# Patient Record
Sex: Male | Born: 1995 | Race: White | Hispanic: No | Marital: Single | Smoking: Former smoker
Health system: Southern US, Community
[De-identification: ages and names within clinical notes are randomized; demographics above are authoritative.]

## PROBLEM LIST (undated history)

## (undated) DIAGNOSIS — M359 Systemic involvement of connective tissue, unspecified: Secondary | ICD-10-CM

## (undated) DIAGNOSIS — M329 Systemic lupus erythematosus, unspecified: Secondary | ICD-10-CM

## (undated) DIAGNOSIS — I517 Cardiomegaly: Secondary | ICD-10-CM

## (undated) DIAGNOSIS — K219 Gastro-esophageal reflux disease without esophagitis: Secondary | ICD-10-CM

## (undated) DIAGNOSIS — I10 Essential (primary) hypertension: Secondary | ICD-10-CM

## (undated) DIAGNOSIS — M1990 Unspecified osteoarthritis, unspecified site: Secondary | ICD-10-CM

## (undated) DIAGNOSIS — I7300 Raynaud's syndrome without gangrene: Secondary | ICD-10-CM

## (undated) HISTORY — PX: TONSILLECTOMY: SUR1361

## (undated) HISTORY — PX: DENTAL SURGERY: SHX609

---

## 2015-10-18 DIAGNOSIS — M0800 Unspecified juvenile rheumatoid arthritis of unspecified site: Secondary | ICD-10-CM | POA: Insufficient documentation

## 2015-10-18 DIAGNOSIS — I7300 Raynaud's syndrome without gangrene: Secondary | ICD-10-CM | POA: Insufficient documentation

## 2015-12-22 DIAGNOSIS — J45909 Unspecified asthma, uncomplicated: Secondary | ICD-10-CM | POA: Insufficient documentation

## 2015-12-22 DIAGNOSIS — I517 Cardiomegaly: Secondary | ICD-10-CM | POA: Insufficient documentation

## 2015-12-22 DIAGNOSIS — Z79899 Other long term (current) drug therapy: Secondary | ICD-10-CM | POA: Insufficient documentation

## 2015-12-22 DIAGNOSIS — D509 Iron deficiency anemia, unspecified: Secondary | ICD-10-CM | POA: Insufficient documentation

## 2015-12-22 DIAGNOSIS — I10 Essential (primary) hypertension: Secondary | ICD-10-CM | POA: Insufficient documentation

## 2016-11-23 DIAGNOSIS — F330 Major depressive disorder, recurrent, mild: Secondary | ICD-10-CM | POA: Insufficient documentation

## 2018-01-31 ENCOUNTER — Encounter (HOSPITAL_COMMUNITY): Payer: Self-pay

## 2018-01-31 ENCOUNTER — Emergency Department (HOSPITAL_COMMUNITY)
Admission: EM | Admit: 2018-01-31 | Discharge: 2018-01-31 | Disposition: A | Discharge status: Home / Self Care | Payer: Medicaid Other | Attending: Emergency Medicine | Admitting: Emergency Medicine

## 2018-01-31 ENCOUNTER — Other Ambulatory Visit: Payer: Self-pay

## 2018-01-31 ENCOUNTER — Emergency Department (HOSPITAL_COMMUNITY): Payer: Medicaid Other

## 2018-01-31 DIAGNOSIS — Z87891 Personal history of nicotine dependence: Secondary | ICD-10-CM | POA: Insufficient documentation

## 2018-01-31 DIAGNOSIS — G8929 Other chronic pain: Secondary | ICD-10-CM | POA: Diagnosis not present

## 2018-01-31 DIAGNOSIS — M25561 Pain in right knee: Secondary | ICD-10-CM | POA: Diagnosis present

## 2018-01-31 DIAGNOSIS — M3210 Systemic lupus erythematosus, organ or system involvement unspecified: Secondary | ICD-10-CM | POA: Insufficient documentation

## 2018-01-31 HISTORY — DX: Unspecified osteoarthritis, unspecified site: M19.90

## 2018-01-31 HISTORY — DX: Systemic lupus erythematosus, unspecified: M32.9

## 2018-01-31 HISTORY — DX: Cardiomegaly: I51.7

## 2018-01-31 HISTORY — DX: Essential (primary) hypertension: I10

## 2018-01-31 HISTORY — DX: Gastro-esophageal reflux disease without esophagitis: K21.9

## 2018-01-31 HISTORY — DX: Systemic involvement of connective tissue, unspecified: M35.9

## 2018-01-31 HISTORY — DX: Raynaud's syndrome without gangrene: I73.00

## 2018-01-31 NOTE — ED Provider Notes (Signed)
[facility]-EMERGENCY DEPT Provider Note   CSN: [id number] Arrival date & time: 01/31/18  1884     History   Chief Complaint Chief Complaint  Patient presents with  . Knee Pain    HPI Dwayne Anderson is a 22 y.o. male.  HPI Patient is a 22 year old male presents the emergency department with complaints of right knee pain over the past 3 days.  He has had recurrent right knee pain for approximately a year.  He states he had a proximal tibia fracture 1 year ago and never had orthopedic surgery which was recommended to him at the time.  Denies fevers and chills.  Has a history of rheumatoid arthritis.  Reports pain and swelling to his right knee intermittently since then.  Over the past 3 days has had increasing moderate pain.   Past Medical History:  Diagnosis Date  . Arthritis   . Connective tissue disorder (HCC)   . Enlarged heart   . GERD (gastroesophageal reflux disease)   . Hypertension   . Lupus (HCC)   . Raynaud's disease     There are no active problems to display for this patient.   Past Surgical History:  Procedure Laterality Date  . DENTAL SURGERY    . TONSILLECTOMY          Home Medications    Prior to Admission medications   Medication Sig Start Date End Date Taking? Authorizing Provider  acetaminophen (TYLENOL) 500 MG tablet Take 2,000 mg by mouth 2 (two) times daily.   Yes [provider]  diphenhydrAMINE (BENADRYL) 25 MG tablet Take 25 mg by mouth every 6 (six) hours as needed for itching or allergies.   Yes [provider]  ibuprofen (ADVIL,MOTRIN) 200 MG tablet Take 400 mg by mouth every 6 (six) hours as needed for moderate pain.   Yes [provider]    Family History History reviewed. No pertinent family history.  Social History Social History   Tobacco Use  . Smoking status: Former Games developer  . Smokeless tobacco: Never Used  Substance Use Topics  . Alcohol use: Yes    Comment: occasionally    . Drug use: Not Currently    Types: Marijuana     Allergies   Other   Review of Systems Review of Systems  All other systems reviewed and are negative.    Physical Exam Updated Vital Signs BP 128/88 (BP Location: Left Arm)   Pulse 91   Temp 98.4 F (36.9 C) (Oral)   Resp 18   Ht 5\' 11"  (1.803 m)   Wt 64.4 kg (142 lb)   SpO2 100%   BMI 19.80 kg/m   Physical Exam  Constitutional: He is oriented to person, place, and time. He appears well-developed and well-nourished.  HENT:  Head: Normocephalic.  Eyes: EOM are normal.  Neck: Normal range of motion.  Pulmonary/Chest: Effort normal.  Abdominal: He exhibits no distension.  Musculoskeletal:  Small right knee effusion.  No surrounding erythema.  Able to range right knee although with some pain.  Compartments soft.  Normal pulses right foot.  Neurological: He is alert and oriented to person, place, and time.  Psychiatric: He has a normal mood and affect.  Nursing note and vitals reviewed.    ED Treatments / Results  Labs (all labs ordered are listed, but only abnormal results are displayed) Labs Reviewed - No data to display  EKG None  Radiology Dg Knee Complete 4 Views Right  Result Date: 01/31/2018  CLINICAL DATA:  22 year old with current history of rheumatoid arthritis and lupus, presenting with a 3 day history of RIGHT knee pain. No known injuries. EXAM: RIGHT KNEE - COMPLETE 4+ VIEW COMPARISON:  None. FINDINGS: No evidence of acute or subacute fracture or dislocation. Well-preserved joint spaces. No intrinsic osseous abnormality. Small to moderate-sized joint effusion. IMPRESSION: No osseous abnormality.  Small to moderate-sized joint effusion. Electronically Signed   By: Hulan Saas M.D.   On: 01/31/2018 08:02    Procedures Procedures (including critical care time)  Medications Ordered in ED Medications - No data to display   Initial Impression / Assessment and Plan / ED Course  I have reviewed  the triage vital signs and the nursing notes.  Pertinent labs & imaging results that were available during my care of the patient were reviewed by me and considered in my medical decision making (see chart for details).     Suspect internal derangement.  X-ray without osseous abnormalities.  Patient given the referral number for an orthopedic surgeon.  Recommended ibuprofen and Tylenol.  Recommended ice, elevation, rest.  Final Clinical Impressions(s) / ED Diagnoses   Final diagnoses:  Recurrent pain of right knee    ED Discharge Orders    None       Azalia Bilis, MD 01/31/18 [phone]

## 2018-01-31 NOTE — ED Triage Notes (Signed)
Patient c/o right knee pain x 3 days. Patient reports that he worked yesterday and now it is throbbing.

## 2018-02-17 ENCOUNTER — Ambulatory Visit (INDEPENDENT_AMBULATORY_CARE_PROVIDER_SITE_OTHER): Payer: Medicaid Other | Admitting: Orthopaedic Surgery

## 2018-02-17 ENCOUNTER — Encounter (INDEPENDENT_AMBULATORY_CARE_PROVIDER_SITE_OTHER): Payer: Self-pay | Admitting: Orthopaedic Surgery

## 2018-02-17 ENCOUNTER — Other Ambulatory Visit (INDEPENDENT_AMBULATORY_CARE_PROVIDER_SITE_OTHER): Payer: Self-pay

## 2018-02-17 DIAGNOSIS — M069 Rheumatoid arthritis, unspecified: Secondary | ICD-10-CM

## 2018-02-17 DIAGNOSIS — M25561 Pain in right knee: Secondary | ICD-10-CM

## 2018-02-17 DIAGNOSIS — M25461 Effusion, right knee: Secondary | ICD-10-CM | POA: Insufficient documentation

## 2018-02-17 DIAGNOSIS — G8929 Other chronic pain: Secondary | ICD-10-CM

## 2018-02-17 MED ORDER — HYDROCODONE-ACETAMINOPHEN 5-325 MG PO TABS: 1.0000 | ORAL_TABLET | Freq: Four times a day (QID) | ORAL | 0 refills | Status: AC | PRN
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
## 2018-02-23 ENCOUNTER — Ambulatory Visit
Admission: RE | Admit: 2018-02-23 | Discharge: 2018-02-23 | Disposition: A | Discharge status: Home / Self Care | Payer: Medicaid Other | Source: Ambulatory Visit | Attending: Orthopaedic Surgery | Admitting: Orthopaedic Surgery

## 2018-02-23 DIAGNOSIS — G8929 Other chronic pain: Secondary | ICD-10-CM

## 2018-02-23 DIAGNOSIS — M25561 Pain in right knee: Principal | ICD-10-CM

## 2018-02-24 ENCOUNTER — Emergency Department (HOSPITAL_COMMUNITY)
Admission: EM | Admit: 2018-02-24 | Discharge: 2018-02-24 | Disposition: A | Discharge status: Home / Self Care | Payer: Medicaid Other | Attending: Emergency Medicine | Admitting: Emergency Medicine

## 2018-02-24 ENCOUNTER — Encounter (HOSPITAL_COMMUNITY): Payer: Self-pay | Admitting: Emergency Medicine

## 2018-02-24 DIAGNOSIS — Z5321 Procedure and treatment not carried out due to patient leaving prior to being seen by health care provider: Secondary | ICD-10-CM | POA: Diagnosis not present

## 2018-02-24 DIAGNOSIS — R509 Fever, unspecified: Secondary | ICD-10-CM | POA: Insufficient documentation

## 2018-02-24 LAB — COMPREHENSIVE METABOLIC PANEL
ALT: 13 U/L — ABNORMAL LOW (ref 17–63)
ANION GAP: 9 (ref 5–15)
AST: 21 U/L (ref 15–41)
Albumin: 4.3 g/dL (ref 3.5–5.0)
Alkaline Phosphatase: 136 U/L — ABNORMAL HIGH (ref 38–126)
BUN: 14 mg/dL (ref 6–20)
CHLORIDE: 107 mmol/L (ref 101–111)
CO2: 24 mmol/L (ref 22–32)
Calcium: 9.3 mg/dL (ref 8.9–10.3)
Creatinine, Ser: 0.88 mg/dL (ref 0.61–1.24)
GFR calc Af Amer: >60 mL/min (ref >60)
GFR calc non Af Amer: >60 mL/min (ref >60)
GLUCOSE: 82 mg/dL (ref 65–99)
POTASSIUM: 4.3 mmol/L (ref 3.5–5.1)
SODIUM: 140 mmol/L (ref 135–145)
TOTAL PROTEIN: 9.1 g/dL — AB (ref 6.5–8.1)
Total Bilirubin: 0.8 mg/dL (ref 0.3–1.2)

## 2018-02-24 LAB — LIPASE, BLOOD: LIPASE: 27 U/L (ref 11–51)

## 2018-02-24 LAB — CBC
HEMATOCRIT: 47 % (ref 39.0–52.0)
HEMOGLOBIN: 15.8 g/dL (ref 13.0–17.0)
MCH: 27.4 pg (ref 26.0–34.0)
MCHC: 33.6 g/dL (ref 30.0–36.0)
MCV: 81.6 fL (ref 78.0–100.0)
Platelets: 261 10*3/uL (ref 150–400)
RBC: 5.76 MIL/uL (ref 4.22–5.81)
RDW: 13.3 % (ref 11.5–15.5)
WBC: 9 10*3/uL (ref 4.0–10.5)

## 2018-02-24 NOTE — ED Triage Notes (Signed)
Pt reports that started vomiting yesterday and having lupus flare up causing generalized pains that are worse with moving around. Also having nasal congestion x 2 days. Denies diarrhea or urinary problems. Repots taking tylenol and pain meds for fever last night.

## 2018-02-25 ENCOUNTER — Other Ambulatory Visit: Payer: Self-pay

## 2018-02-25 ENCOUNTER — Emergency Department (HOSPITAL_COMMUNITY)
Admission: EM | Admit: 2018-02-25 | Discharge: 2018-02-25 | Disposition: A | Discharge status: Home / Self Care | Payer: Medicaid Other | Attending: Emergency Medicine | Admitting: Emergency Medicine

## 2018-02-25 ENCOUNTER — Encounter (HOSPITAL_COMMUNITY): Payer: Self-pay | Admitting: Emergency Medicine

## 2018-02-25 DIAGNOSIS — I10 Essential (primary) hypertension: Secondary | ICD-10-CM | POA: Insufficient documentation

## 2018-02-25 DIAGNOSIS — M329 Systemic lupus erythematosus, unspecified: Secondary | ICD-10-CM | POA: Insufficient documentation

## 2018-02-25 DIAGNOSIS — M25562 Pain in left knee: Secondary | ICD-10-CM | POA: Insufficient documentation

## 2018-02-25 DIAGNOSIS — Z87891 Personal history of nicotine dependence: Secondary | ICD-10-CM | POA: Insufficient documentation

## 2018-02-25 DIAGNOSIS — J3489 Other specified disorders of nose and nasal sinuses: Secondary | ICD-10-CM | POA: Insufficient documentation

## 2018-02-25 DIAGNOSIS — M25531 Pain in right wrist: Secondary | ICD-10-CM | POA: Diagnosis not present

## 2018-02-25 DIAGNOSIS — R509 Fever, unspecified: Secondary | ICD-10-CM | POA: Insufficient documentation

## 2018-02-25 DIAGNOSIS — R110 Nausea: Secondary | ICD-10-CM

## 2018-02-25 LAB — COMPREHENSIVE METABOLIC PANEL
ALT: 12 U/L — AB (ref 17–63)
AST: 21 U/L (ref 15–41)
Albumin: 4.1 g/dL (ref 3.5–5.0)
Alkaline Phosphatase: 140 U/L — ABNORMAL HIGH (ref 38–126)
Anion gap: 11 (ref 5–15)
BILIRUBIN TOTAL: 0.6 mg/dL (ref 0.3–1.2)
BUN: 12 mg/dL (ref 6–20)
CHLORIDE: 101 mmol/L (ref 101–111)
CO2: 23 mmol/L (ref 22–32)
Calcium: 9.5 mg/dL (ref 8.9–10.3)
Creatinine, Ser: 0.82 mg/dL (ref 0.61–1.24)
GFR calc Af Amer: >60 mL/min (ref >60)
Glucose, Bld: 90 mg/dL (ref 65–99)
Potassium: 3.9 mmol/L (ref 3.5–5.1)
Sodium: 135 mmol/L (ref 135–145)
Total Protein: 8.2 g/dL — ABNORMAL HIGH (ref 6.5–8.1)

## 2018-02-25 LAB — CBC
HEMATOCRIT: 43.4 % (ref 39.0–52.0)
HEMOGLOBIN: 14.6 g/dL (ref 13.0–17.0)
MCH: 27 pg (ref 26.0–34.0)
MCHC: 33.6 g/dL (ref 30.0–36.0)
MCV: 80.2 fL (ref 78.0–100.0)
Platelets: 275 10*3/uL (ref 150–400)
RBC: 5.41 MIL/uL (ref 4.22–5.81)
RDW: 13.2 % (ref 11.5–15.5)
WBC: 8.5 10*3/uL (ref 4.0–10.5)

## 2018-02-25 LAB — URINALYSIS, ROUTINE W REFLEX MICROSCOPIC
Bilirubin Urine: NEGATIVE
Glucose, UA: NEGATIVE mg/dL
HGB URINE DIPSTICK: NEGATIVE
Ketones, ur: 5 mg/dL — AB
Leukocytes, UA: NEGATIVE
NITRITE: NEGATIVE
PH: 5 (ref 5.0–8.0)
Protein, ur: NEGATIVE mg/dL
SPECIFIC GRAVITY, URINE: 1.029 (ref 1.005–1.030)

## 2018-02-25 LAB — LIPASE, BLOOD: Lipase: 29 U/L (ref 11–51)

## 2018-02-25 MED ORDER — KETOROLAC TROMETHAMINE 30 MG/ML IJ SOLN
30.0000 mg | Freq: Once | INTRAMUSCULAR | Status: AC
  Administered 2018-02-25: 30 mg via INTRAVENOUS
  Filled 2018-02-25: qty 1

## 2018-02-25 MED ORDER — ONDANSETRON HCL 4 MG/2ML IJ SOLN
4.0000 mg | Freq: Once | INTRAMUSCULAR | Status: AC
  Administered 2018-02-25: 4 mg via INTRAVENOUS
  Filled 2018-02-25: qty 2

## 2018-02-25 MED ORDER — SODIUM CHLORIDE 0.9 % IV BOLUS
1000.0000 mL | Freq: Once | INTRAVENOUS | Status: AC
  Administered 2018-02-25: 1000 mL via INTRAVENOUS

## 2018-02-25 MED ORDER — ONDANSETRON HCL 4 MG PO TABS: 4.0000 mg | ORAL_TABLET | Freq: Four times a day (QID) | ORAL | 0 refills | Status: AC | PRN

## 2018-02-25 MED ORDER — PREDNISONE 10 MG PO TABS: ORAL_TABLET | ORAL | 0 refills | Status: AC

## 2018-02-25 MED ORDER — METHYLPREDNISOLONE SODIUM SUCC 125 MG IJ SOLR
125.0000 mg | Freq: Once | INTRAMUSCULAR | Status: AC
  Administered 2018-02-25: 125 mg via INTRAVENOUS
  Filled 2018-02-25: qty 2

## 2018-02-25 NOTE — ED Triage Notes (Signed)
Pt reports nausea/vomiting x 1 day, hx lupus, states he feels like he may be starting to have a lupus flare. Denies diarrhea/urinary problems.

## 2018-02-25 NOTE — Discharge Instructions (Addendum)
Take the prednisone taper as prescribed.   Talk with your doctor about getting referred to a rheumatologist.  Return if symptoms are getting worse.

## 2018-02-25 NOTE — ED Provider Notes (Signed)
[facility] EMERGENCY DEPARTMENT Provider Note   CSN: [id number] Arrival date & time: 02/25/18  0226     History   Chief Complaint Chief Complaint  Patient presents with  . Abdominal Pain    HPI Dwayne Anderson is a 22 y.o. male.  The history is provided by the patient.  He has history of lupus, rheumatoid arthritis, hypertension and comes in with what he says is an exacerbation of his lupus and rheumatoid arthritis.  He started having sinus congestion 4 days ago and started taking prednisone 10 mg.  2 days ago, he started having fever which went as high as 102.  There has been aching in his joints-especially his right wrist and left knee.  He has had some nausea and vomiting, but states he thinks that is more because he has not been eating.  He denies chills or sweats.  There has been no diarrhea.  Of note, he is not on any NSAIDs.  He currently does not have a rheumatologist.  Past Medical History:  Diagnosis Date  . Arthritis   . Connective tissue disorder (HCC)   . Enlarged heart   . GERD (gastroesophageal reflux disease)   . Hypertension   . Lupus (HCC)   . Raynaud's disease     Patient Active Problem List   Diagnosis Date Noted  . Acute pain of right knee 02/17/2018  . Effusion, right knee 02/17/2018    Past Surgical History:  Procedure Laterality Date  . DENTAL SURGERY    . TONSILLECTOMY          Home Medications    Prior to Admission medications   Medication Sig Start Date End Date Taking? Authorizing Provider  acetaminophen (TYLENOL) 500 MG tablet Take 2,000 mg by mouth 2 (two) times daily as needed for mild pain.    Yes [provider]  diphenhydrAMINE (BENADRYL) 25 MG tablet Take 25 mg by mouth every 6 (six) hours as needed for itching or allergies.   Yes [provider]  HYDROcodone-acetaminophen (NORCO/VICODIN) 5-325 MG tablet Take 1 tablet by mouth every 6 (six) hours as needed for moderate pain. 02/17/18  Yes  Kathryne Hitch, MD  ibuprofen (ADVIL,MOTRIN) 200 MG tablet Take 400 mg by mouth every 6 (six) hours as needed for moderate pain.   Yes [provider]  predniSONE (DELTASONE) 2.5 MG tablet Take 2.5-10 mg by mouth daily. 4 tablets for 2 days 2 tablets for 2 days  1 tablet for 2 days   Yes [provider]    Family History No family history on file.  Social History Social History   Tobacco Use  . Smoking status: Former Games developer  . Smokeless tobacco: Never Used  Substance Use Topics  . Alcohol use: Yes    Comment: occasionally  . Drug use: Not Currently    Types: Marijuana     Allergies   Other   Review of Systems Review of Systems  All other systems reviewed and are negative.    Physical Exam Updated Vital Signs BP 125/73   Pulse 96   Temp 99.2 F (37.3 C) (Oral)   Resp 18   SpO2 99%   Physical Exam  Nursing note and vitals reviewed.  22 year old male, resting comfortably and in no acute distress. Vital signs are normal. Oxygen saturation is 99%, which is normal. Head is normocephalic and atraumatic. PERRLA, EOMI. Oropharynx is clear. Neck is nontender and supple without adenopathy or JVD. Back is nontender  and there is no CVA tenderness. Lungs are clear without rales, wheezes, or rhonchi. Chest is nontender. Heart has regular rate and rhythm without murmur. Abdomen is soft, flat, nontender without masses or hepatosplenomegaly and peristalsis is hypoactive. Extremities have no cyanosis or edema, full range of motion is present.  Moderate synovial thickening noted right wrist.  There is tenderness palpation over this area.  Moderate tenderness medial aspect of left knee without any synovial thickening. Skin is warm and dry without rash. Neurologic: Mental status is normal, cranial nerves are intact, there are no motor or sensory deficits.  ED Treatments / Results  Labs (all labs ordered are listed, but only abnormal results are  displayed) Labs Reviewed  COMPREHENSIVE METABOLIC PANEL - Abnormal; Notable for the following components:      Result Value   Total Protein 8.2 (*)    ALT 12 (*)    Alkaline Phosphatase 140 (*)    All other components within normal limits  URINALYSIS, ROUTINE W REFLEX MICROSCOPIC - Abnormal; Notable for the following components:   APPearance HAZY (*)    Ketones, ur 5 (*)    All other components within normal limits  LIPASE, BLOOD  CBC   Procedures Procedures   Medications Ordered in ED Medications  ondansetron (ZOFRAN) injection 4 mg (4 mg Intravenous Given 02/25/18 0612)  ketorolac (TORADOL) 30 MG/ML injection 30 mg (30 mg Intravenous Given 02/25/18 0612)  sodium chloride 0.9 % bolus 1,000 mL (1,000 mLs Intravenous New Bag/Given 02/25/18 0612)  methylPREDNISolone sodium succinate (SOLU-MEDROL) 125 mg/2 mL injection 125 mg (125 mg Intravenous Given 02/25/18 0612)     Initial Impression / Assessment and Plan / ED Course  I have reviewed the triage vital signs and the nursing notes.  Pertinent labs & imaging results that were available during my care of the patient were reviewed by me and considered in my medical decision making (see chart for details).  Fever with arthralgia which likely represents an exacerbation of lupus and/or rheumatoid arthritis.  Old records are reviewed, and he has several similar ED visits.  I have restarted and he is given IV fluids, ondansetron, and methylprednisolone.  Screening labs are unremarkable.  He will need to get established with a rheumatologist.  He feels better following above-noted treatment and is discharged with prescription for a 2-week prednisone taper, and ondansetron.  Final Clinical Impressions(s) / ED Diagnoses   Final diagnoses:  Fever, unspecified fever cause  Nausea  Arthralgia of left knee  Arthralgia of right wrist    ED Discharge Orders        Ordered    predniSONE (DELTASONE) 10 MG tablet     02/25/18 0737    ondansetron  (ZOFRAN) 4 MG tablet  Every 6 hours PRN     02/25/18 0737       Dione Booze, MD 02/25/18 [phone]

## 2018-03-02 ENCOUNTER — Inpatient Hospital Stay (INDEPENDENT_AMBULATORY_CARE_PROVIDER_SITE_OTHER): Payer: Self-pay | Admitting: Orthopedic Surgery

## 2018-03-03 ENCOUNTER — Ambulatory Visit (INDEPENDENT_AMBULATORY_CARE_PROVIDER_SITE_OTHER): Payer: Medicaid Other | Admitting: Orthopaedic Surgery

## 2018-03-26 ENCOUNTER — Encounter (HOSPITAL_COMMUNITY): Payer: Self-pay

## 2018-03-26 ENCOUNTER — Emergency Department (HOSPITAL_COMMUNITY)
Admission: EM | Admit: 2018-03-26 | Discharge: 2018-03-26 | Disposition: A | Discharge status: Home / Self Care | Payer: Medicaid Other | Attending: Emergency Medicine | Admitting: Emergency Medicine

## 2018-03-26 DIAGNOSIS — R10815 Periumbilic abdominal tenderness: Secondary | ICD-10-CM | POA: Insufficient documentation

## 2018-03-26 DIAGNOSIS — R10814 Left lower quadrant abdominal tenderness: Secondary | ICD-10-CM | POA: Insufficient documentation

## 2018-03-26 DIAGNOSIS — M62838 Other muscle spasm: Secondary | ICD-10-CM | POA: Diagnosis not present

## 2018-03-26 DIAGNOSIS — M069 Rheumatoid arthritis, unspecified: Secondary | ICD-10-CM | POA: Insufficient documentation

## 2018-03-26 DIAGNOSIS — J029 Acute pharyngitis, unspecified: Secondary | ICD-10-CM | POA: Diagnosis not present

## 2018-03-26 DIAGNOSIS — M3210 Systemic lupus erythematosus, organ or system involvement unspecified: Secondary | ICD-10-CM | POA: Insufficient documentation

## 2018-03-26 DIAGNOSIS — R509 Fever, unspecified: Secondary | ICD-10-CM | POA: Insufficient documentation

## 2018-03-26 DIAGNOSIS — R51 Headache: Secondary | ICD-10-CM | POA: Insufficient documentation

## 2018-03-26 DIAGNOSIS — R590 Localized enlarged lymph nodes: Secondary | ICD-10-CM | POA: Diagnosis not present

## 2018-03-26 LAB — URINALYSIS, ROUTINE W REFLEX MICROSCOPIC
Bilirubin Urine: NEGATIVE
Glucose, UA: NEGATIVE mg/dL
Hgb urine dipstick: NEGATIVE
Ketones, ur: NEGATIVE mg/dL
Leukocytes, UA: NEGATIVE
Nitrite: NEGATIVE
Protein, ur: NEGATIVE mg/dL
Specific Gravity, Urine: 1.008 (ref 1.005–1.030)
pH: 6 (ref 5.0–8.0)

## 2018-03-26 LAB — CBC WITH DIFFERENTIAL/PLATELET
Basophils Absolute: 0 10*3/uL (ref 0.0–0.1)
Basophils Relative: 0 %
Eosinophils Absolute: 0.1 10*3/uL (ref 0.0–0.7)
Eosinophils Relative: 1 %
HCT: 41.3 % (ref 39.0–52.0)
Hemoglobin: 13.8 g/dL (ref 13.0–17.0)
Lymphocytes Relative: 19 %
Lymphs Abs: 1 10*3/uL (ref 0.7–4.0)
MCH: 26.5 pg (ref 26.0–34.0)
MCHC: 33.4 g/dL (ref 30.0–36.0)
MCV: 79.3 fL (ref 78.0–100.0)
Monocytes Absolute: 0.5 10*3/uL (ref 0.1–1.0)
Monocytes Relative: 10 %
Neutro Abs: 3.7 10*3/uL (ref 1.7–7.7)
Neutrophils Relative %: 70 %
Platelets: 232 10*3/uL (ref 150–400)
RBC: 5.21 MIL/uL (ref 4.22–5.81)
RDW: 13.1 % (ref 11.5–15.5)
WBC: 5.4 10*3/uL (ref 4.0–10.5)

## 2018-03-26 LAB — COMPREHENSIVE METABOLIC PANEL
ALT: 11 U/L — ABNORMAL LOW (ref 17–63)
AST: 21 U/L (ref 15–41)
Albumin: 3.8 g/dL (ref 3.5–5.0)
Alkaline Phosphatase: 139 U/L — ABNORMAL HIGH (ref 38–126)
Anion gap: 10 (ref 5–15)
BUN: 11 mg/dL (ref 6–20)
CO2: 22 mmol/L (ref 22–32)
Calcium: 8.8 mg/dL — ABNORMAL LOW (ref 8.9–10.3)
Chloride: 104 mmol/L (ref 101–111)
Creatinine, Ser: 0.71 mg/dL (ref 0.61–1.24)
GFR calc Af Amer: >60 mL/min (ref >60)
GFR calc non Af Amer: >60 mL/min (ref >60)
Glucose, Bld: 105 mg/dL — ABNORMAL HIGH (ref 65–99)
Potassium: 3.7 mmol/L (ref 3.5–5.1)
Sodium: 136 mmol/L (ref 135–145)
Total Bilirubin: 0.6 mg/dL (ref 0.3–1.2)
Total Protein: 7.6 g/dL (ref 6.5–8.1)

## 2018-03-26 LAB — LIPASE, BLOOD: Lipase: 27 U/L (ref 11–51)

## 2018-03-26 LAB — GROUP A STREP BY PCR: GROUP A STREP BY PCR: NOT DETECTED

## 2018-03-26 MED ORDER — DIPHENHYDRAMINE HCL 25 MG PO CAPS
25.0000 mg | ORAL_CAPSULE | Freq: Once | ORAL | Status: AC
Start: 2018-03-26 — End: 2018-03-26
  Administered 2018-03-26: 25 mg via ORAL
  Filled 2018-03-26: qty 1

## 2018-03-26 MED ORDER — SODIUM CHLORIDE 0.9 % IV BOLUS
1000.0000 mL | Freq: Once | INTRAVENOUS | Status: AC
  Administered 2018-03-26: 1000 mL via INTRAVENOUS

## 2018-03-26 MED ORDER — METHYLPREDNISOLONE SODIUM SUCC 125 MG IJ SOLR
80.0000 mg | Freq: Once | INTRAMUSCULAR | Status: AC
  Administered 2018-03-26: 80 mg via INTRAVENOUS
  Filled 2018-03-26: qty 2

## 2018-03-26 MED ORDER — METHOCARBAMOL 500 MG PO TABS: 500.0000 mg | ORAL_TABLET | Freq: Two times a day (BID) | ORAL | 0 refills | Status: AC

## 2018-03-26 MED ORDER — METHOCARBAMOL 500 MG PO TABS
750.0000 mg | ORAL_TABLET | Freq: Once | ORAL | Status: AC
  Administered 2018-03-26: 750 mg via ORAL
  Filled 2018-03-26: qty 2

## 2018-03-26 MED ORDER — KETOROLAC TROMETHAMINE 30 MG/ML IJ SOLN
30.0000 mg | Freq: Once | INTRAMUSCULAR | Status: AC
  Administered 2018-03-26: 30 mg via INTRAVENOUS
  Filled 2018-03-26: qty 1

## 2018-03-26 NOTE — Discharge Instructions (Signed)
Take Robaxin twice daily as needed for muscle pain or spasms.  Do not drive or operate machinery while taking this medication.  Take Tylenol as prescribed over-the-counter, as needed for fever.  Resume taking the medications you have at home as prescribed for your lupus flares.  Please see your doctor in 3 to 4 days for recheck.  Please return to the emergency department if you develop any new or worsening symptoms.

## 2018-03-26 NOTE — ED Provider Notes (Signed)
[facility] DEPT Provider Note   CSN: [id number] Arrival date & time: 03/26/18  0504     History   Chief Complaint Chief Complaint  Patient presents with  . flu like sx    HPI Dwayne Anderson is a 22 y.o. male with history of lupus, rheumatoid arthritis, Raynaud's disease, GERD who presents with a 1 day history of fever, chills, sore throat, joint pain, and right-sided neck pain.  Patient reports he frequently has muscle tightness in his neck, joint pain, and fever during his lupus flareups.  He ran out of his muscle relaxer.  He reports he does not usually have sore throat in his flareups.  He does not have any pain when he swallows, but has pain to his anterior neck like swollen lymph nodes.  He has not taken any medications at home for his symptoms.  He has had some abdominal pain, however states he feels constipated and dehydrated.  He denies any chest pain, shortness of breath, vomiting.  He has had some mild nausea, however he has not since yesterday.  HPI  Past Medical History:  Diagnosis Date  . Arthritis   . Connective tissue disorder ([place])   . Enlarged heart   . GERD (gastroesophageal reflux disease)   . Hypertension   . Lupus ([place])   . Raynaud's disease     Patient Active Problem List   Diagnosis Date Noted  . Acute pain of right knee 02/17/2018  . Effusion, right knee 02/17/2018    Past Surgical History:  Procedure Laterality Date  . DENTAL SURGERY    . TONSILLECTOMY          Home Medications    Prior to Admission medications   Medication Sig Start Date End Date Taking? Authorizing Provider  acetaminophen (TYLENOL) 500 MG tablet Take 2,000 mg by mouth 2 (two) times daily as needed for mild pain.    Yes [provider]  diphenhydrAMINE (BENADRYL) 25 MG tablet Take 25 mg by mouth every 6 (six) hours as needed for itching or allergies.   Yes [provider]  ibuprofen (ADVIL,MOTRIN) 200 MG tablet Take 400 mg  by mouth every 6 (six) hours as needed for moderate pain.   Yes [provider]  ondansetron (ZOFRAN) 4 MG tablet Take 1 tablet (4 mg total) by mouth every 6 (six) hours as needed for nausea or vomiting. 06/04/55  Yes Delora Fuel, MD  HYDROcodone-acetaminophen (NORCO/VICODIN) 5-325 MG tablet Take 1 tablet by mouth every 6 (six) hours as needed for moderate pain. Patient not taking: Reported on 03/26/2018 02/17/18   Mcarthur Rossetti, MD  methocarbamol (ROBAXIN) 500 MG tablet Take 1 tablet (500 mg total) by mouth 2 (two) times daily. 03/26/18   Kouper Spinella, Bea Graff, PA-C  predniSONE (DELTASONE) 10 MG tablet 6 tabs po daily x 3 days, then 4 tabs x 3 days, then 3 tabs x 3 days, then 2 tab x 3 days, then 1 tabs x 3 days Patient not taking: Reported on 4/33/2951 06/04/40   Delora Fuel, MD    Family History History reviewed. No pertinent family history.  Social History Social History   Tobacco Use  . Smoking status: Former Research scientist (life sciences)  . Smokeless tobacco: Never Used  Substance Use Topics  . Alcohol use: Yes    Comment: occasionally  . Drug use: Not Currently    Types: Marijuana     Allergies   Penciclovir; Penicillins; and Other   Review of Systems Review of Systems  Constitutional: Positive for fever. Negative for chills.  HENT: Positive for sore throat. Negative for congestion and facial swelling.   Respiratory: Negative for cough and shortness of breath.   Cardiovascular: Negative for chest pain.  Gastrointestinal: Positive for abdominal pain, constipation and nausea. Negative for vomiting.  Genitourinary: Negative for dysuria.  Musculoskeletal: Positive for neck pain. Negative for back pain.  Skin: Negative for rash and wound.  Neurological: Positive for headaches.  Psychiatric/Behavioral: The patient is not nervous/anxious.      Physical Exam Updated Vital Signs BP 116/70 (BP Location: Right Arm)   Pulse (!) 53   Temp 98.1 F (36.7 C) (Oral)   Resp 18   SpO2 96%     Physical Exam  Constitutional: He appears well-developed and well-nourished. No distress.  HENT:  Head: Normocephalic and atraumatic.  Mouth/Throat: Oropharynx is clear and moist. No oropharyngeal exudate.  Eyes: Pupils are equal, round, and reactive to light. Conjunctivae are normal. Right eye exhibits no discharge. Left eye exhibits no discharge. No scleral icterus.  Neck: Normal range of motion. Neck supple. Muscular tenderness (R upper trapezius spasm and tenderness) present. No spinous process tenderness present. No neck rigidity (chin to chest without difficulty). No thyromegaly present.    Cardiovascular: Normal rate, regular rhythm, normal heart sounds and intact distal pulses. Exam reveals no gallop and no friction rub.  No murmur heard. Pulmonary/Chest: Effort normal and breath sounds normal. No stridor. No respiratory distress. He has no wheezes. He has no rales.  Abdominal: Soft. Bowel sounds are normal. He exhibits no distension. There is tenderness in the suprapubic area and left lower quadrant. There is no rebound, no guarding and no CVA tenderness.  Musculoskeletal: He exhibits no edema.  Lymphadenopathy:    He has cervical adenopathy (bilateral, mobile anterior chain, 2 1 cm palpable nodes).  Neurological: He is alert. Coordination normal.  Skin: Skin is warm and dry. No rash noted. He is not diaphoretic. No pallor.  Psychiatric: He has a normal mood and affect.  Nursing note and vitals reviewed.    ED Treatments / Results  Labs (all labs ordered are listed, but only abnormal results are displayed) Labs Reviewed  COMPREHENSIVE METABOLIC PANEL - Abnormal; Notable for the following components:      Result Value   Glucose, Bld 105 (*)    Calcium 8.8 (*)    ALT 11 (*)    Alkaline Phosphatase 139 (*)    All other components within normal limits  GROUP A STREP BY PCR  CBC WITH DIFFERENTIAL/PLATELET  LIPASE, BLOOD  URINALYSIS, ROUTINE W REFLEX MICROSCOPIC     EKG None  Radiology No results found.  Procedures Procedures (including critical care time)  Medications Ordered in ED Medications  sodium chloride 0.9 % bolus 1,000 mL (0 mLs Intravenous Stopped 03/26/18 0915)  diphenhydrAMINE (BENADRYL) capsule 25 mg (25 mg Oral Given 03/26/18 0814)  methocarbamol (ROBAXIN) tablet 750 mg (750 mg Oral Given 03/26/18 0814)  ketorolac (TORADOL) 30 MG/ML injection 30 mg (30 mg Intravenous Given 03/26/18 0814)  methylPREDNISolone sodium succinate (SOLU-MEDROL) 125 mg/2 mL injection 80 mg (80 mg Intravenous Given 03/26/18 0824)  sodium chloride 0.9 % bolus 1,000 mL (0 mLs Intravenous Stopped 03/26/18 1205)     Initial Impression / Assessment and Plan / ED Course  I have reviewed the triage vital signs and the nursing notes.  Pertinent labs & imaging results that were available during my care of the patient were reviewed by me and considered in my medical  decision making (see chart for details).     Patient presenting with fever, joint pain, sore throat, neck pain.  Patient reports her symptoms are consistent with normal flareup of lupus.  After 2 L of fluid, Solu-Medrol, Toradol, Robaxin, Benadryl, patient is feeling much better.  CBC, CMP, lipase within normal limits, except for mild elevation in alk phos which patient has had in the past.  Strep negative.  UA negative.  Repeat abdominal exam is benign.  Patient has prednisone, methotrexate at home to begin taking for his flareup.  Advised follow-up with his PCP.  Strict return precautions given.  Patient understands and agrees with plan.  Patient vitals stable throughout ED course and discharged in satisfactory condition. I discussed patient case with Dr. Zenia Resides who guided the patient's management and agrees with plan.   Final Clinical Impressions(s) / ED Diagnoses   Final diagnoses:  Fever and chills  Sore throat  Muscle spasms of neck    ED Discharge Orders        Ordered    methocarbamol  (ROBAXIN) 500 MG tablet  2 times daily     03/26/18 [address] [place] 03/26/18 1504    Lacretia Leigh, MD 03/27/18 1731

## 2018-03-26 NOTE — ED Triage Notes (Signed)
Pt states that he's having a hard time turning his neck to the right

## 2018-03-26 NOTE — ED Notes (Signed)
Pt c/o sore throat, fever and generalized body aches, sts those are usual symptoms of lupus flare up.

## 2018-03-26 NOTE — ED Notes (Signed)
Bed: WTR5 Expected date:  Expected time:  Means of arrival:  Comments: 

## 2018-03-27 ENCOUNTER — Emergency Department (HOSPITAL_COMMUNITY)
Admission: EM | Admit: 2018-03-27 | Discharge: 2018-03-27 | Disposition: A | Discharge status: Home / Self Care | Payer: Medicaid Other | Attending: Emergency Medicine | Admitting: Emergency Medicine

## 2018-03-27 DIAGNOSIS — Z5321 Procedure and treatment not carried out due to patient leaving prior to being seen by health care provider: Secondary | ICD-10-CM | POA: Diagnosis not present

## 2018-03-27 DIAGNOSIS — H5712 Ocular pain, left eye: Secondary | ICD-10-CM | POA: Insufficient documentation
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
## 2018-03-27 NOTE — ED Triage Notes (Signed)
Pt states he does not want to be evaluated and he is going to leave

## 2018-05-05 ENCOUNTER — Telehealth (INDEPENDENT_AMBULATORY_CARE_PROVIDER_SITE_OTHER): Payer: Self-pay | Admitting: *Deleted

## 2018-05-05 NOTE — Telephone Encounter (Signed)
Pt is scheduled at [facility] with Dr. Herma Carson on Aug 5 at 5:45pm, I called and left message on vm for pt to return my call. Pending call back.

## 2018-05-12 ENCOUNTER — Encounter (INDEPENDENT_AMBULATORY_CARE_PROVIDER_SITE_OTHER): Payer: Self-pay | Admitting: *Deleted

## 2018-05-13 NOTE — Telephone Encounter (Signed)
Sent message letter advising of appt

## 2018-06-01 IMAGING — MR MR KNEE*R* W/O CM
6 series · 34 of 40 positions shown · non-contrast
Comparison: 01/31/2018

CLINICAL DATA: Knee locking, catching, snapping, and crepitus.
Right knee pain.

EXAM:
MRI OF THE RIGHT KNEE WITHOUT CONTRAST
TECHNIQUE: Multiplanar, multisequence MR imaging of the knee was performed. No
intravenous contrast was administered.

[Series 4: T1 · coronal · 3.5mm · 0.25mm/px · 1 of 24 slices shown]
[im 1/24]
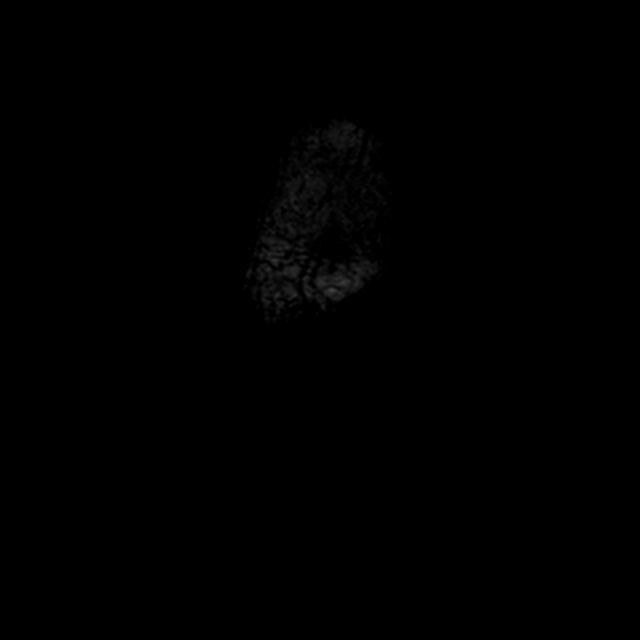

[Series 6: PD fat-sat · axial · 4.0mm · 0.50mm/px · z∈[-68,+54]mm · 8 of 28 slices shown (1 of 4)]
[im 1/28]
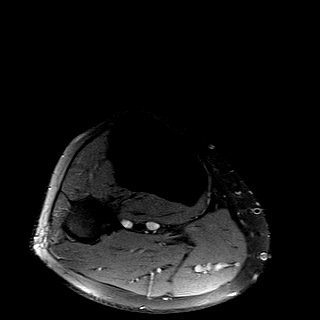
[im 4/28]
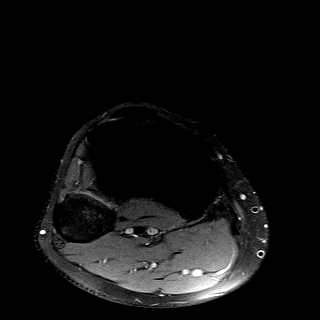
[im 8/28]
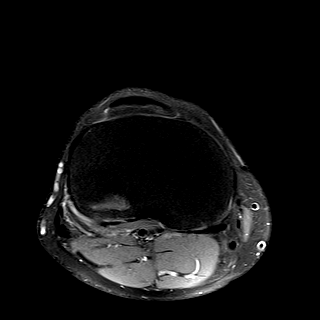
[im 12/28]
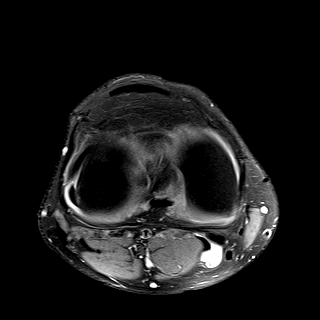
[im 16/28]
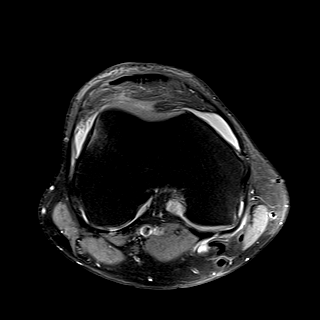
[im 20/28]
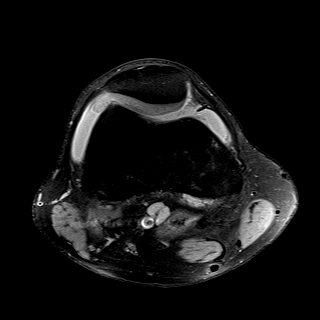
[im 24/28]
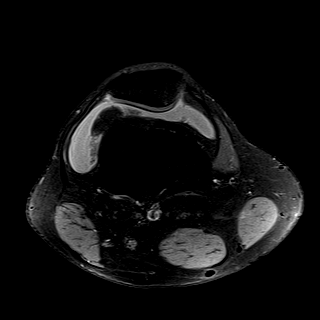
[im 28/28]
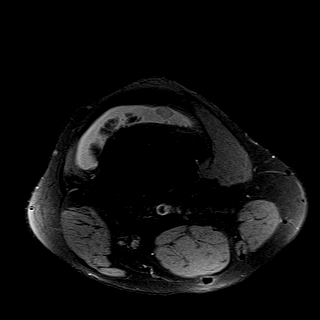

[Series 7: PD fat-sat · sagittal · 3.5mm · 0.50mm/px · 8 of 27 slices shown (2 of 4)]
[im 1/27]
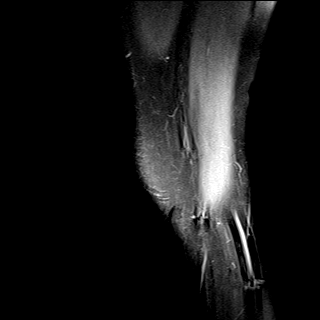
[im 4/27]
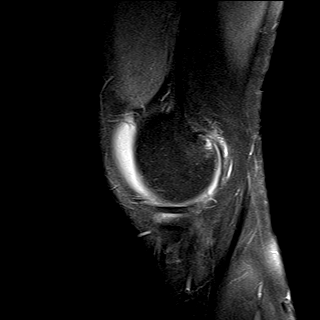
[im 8/27]
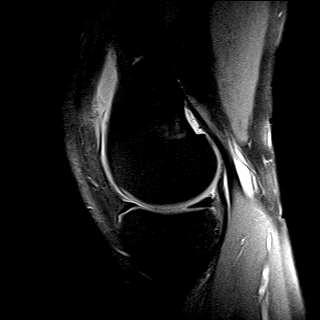
[im 12/27]
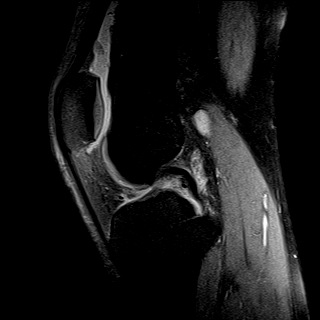
[im 15/27]
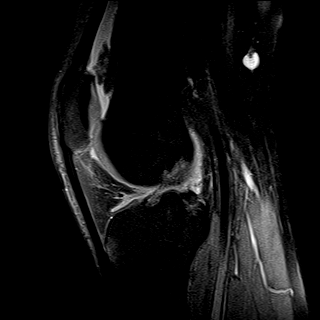
[im 19/27]
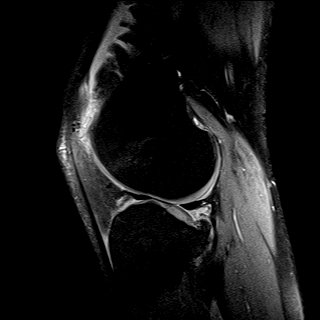
[im 23/27]
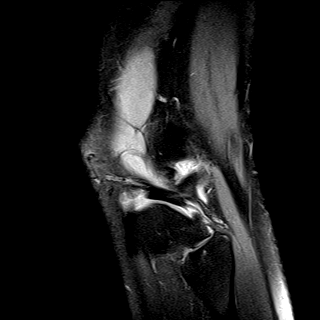
[im 27/27]
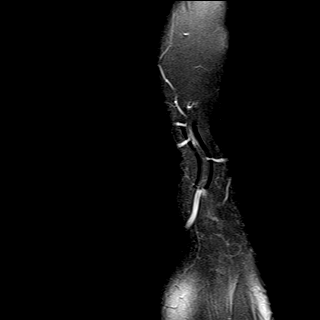

[Series 8: T2 fat-sat · coronal · 3.5mm · 0.50mm/px · 7 of 24 slices shown]
[im 1/24]
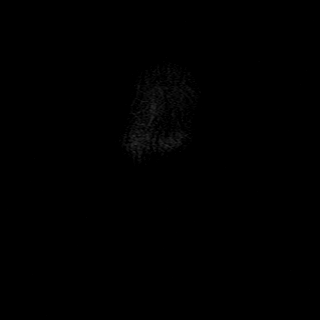
[im 4/24]
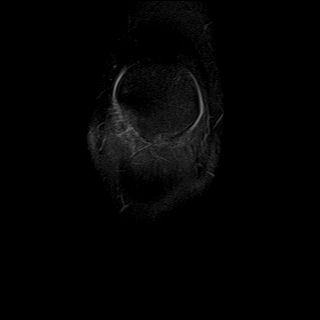
[im 8/24]
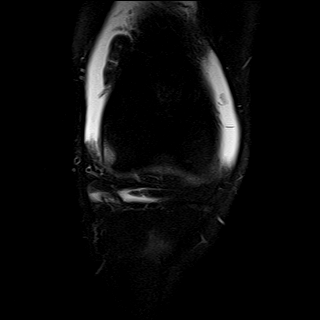
[im 12/24]
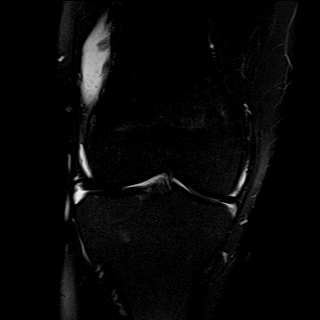
[im 16/24]
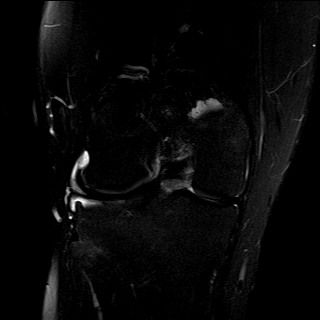
[im 20/24]
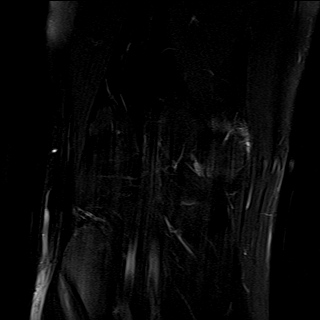
[im 24/24]
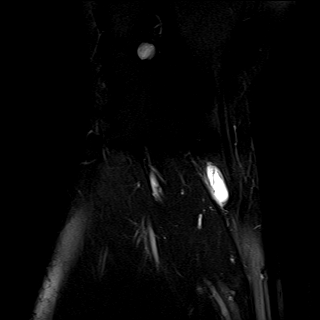

[Series 9: PD fat-sat · coronal · 3.5mm · 0.50mm/px · 7 of 24 slices shown (3 of 4)]
[im 1/24]
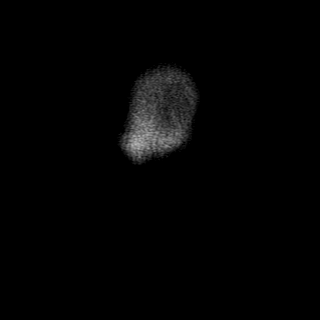
[im 4/24]
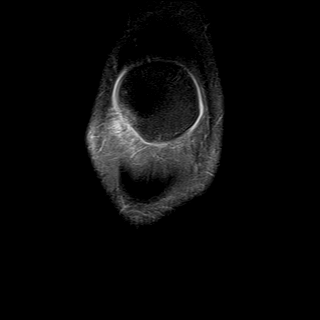
[im 8/24]
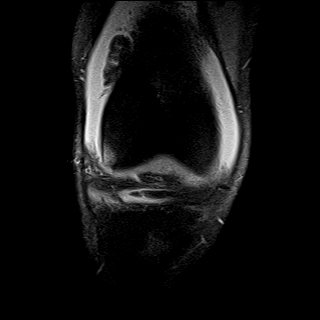
[im 12/24]
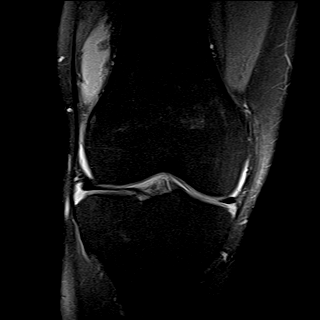
[im 16/24]
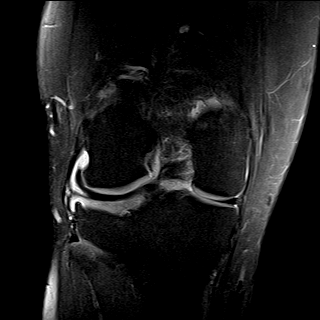
[im 20/24]
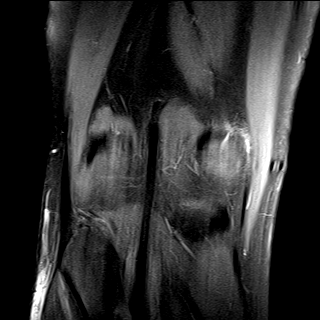
[im 24/24]
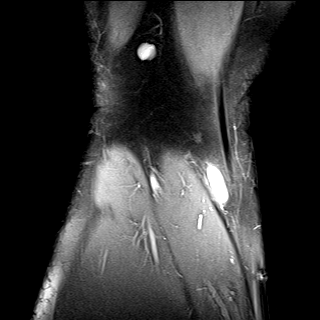

[Series 10: PD fat-sat · oblique · 2.7mm · 0.29mm/px · 3 of 11 slices shown (4 of 4)]
[im 1/11]
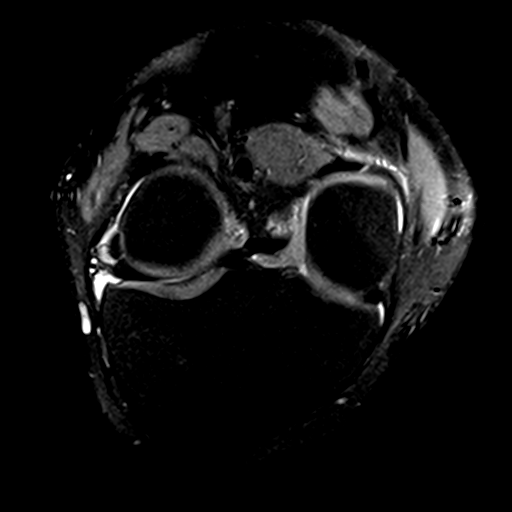
[im 6/11]
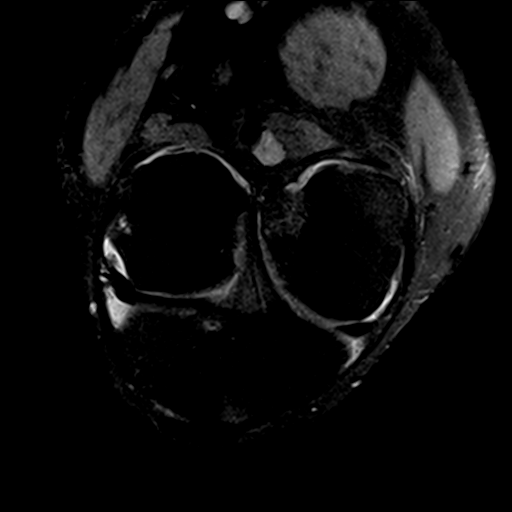
[im 11/11]
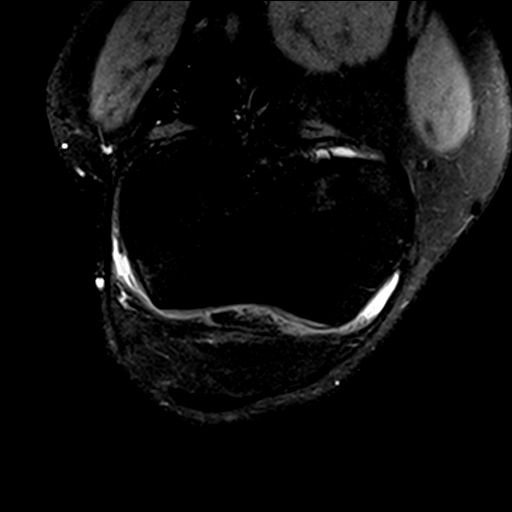

[34 of 40 positions shown; findings below may reference images not displayed]

FINDINGS: MENISCI

Medial meniscus:  Unremarkable

Lateral meniscus:  Unremarkable

LIGAMENTS

Cruciates:  Unremarkable

Collaterals:  Unremarkable

CARTILAGE

Patellofemoral:  Unremarkable

Medial:  Unremarkable

Lateral: The patient has had a prior rim compression fracture of the
posterior aspect of the lateral tibial plateau with potentially up
to 9 mm of depression, and some irregular bony spurring posteriorly
as shown on image [DATE]. There is some focal chondral heterogeneity
and irregularity along the involved segment of the lateral tibial
plateau posteriorly, for example on images 8-9 of series 9. The
cortex in the region of the compression is somewhat irregular
specially posteriorly along the edge of the spur.

Joint: Moderate joint effusion with mild synovitis. Thickened medial
plica. I do not see a definite free fragment.

Popliteal Fossa: Popliteal lymph node 0.9 cm in short axis on image
[DATE]. More cephalad there is a subcutaneous lymph node measuring
cm in short axis, image [DATE].

Small Baker's cyst with internal septation, measuring 4.0 by 1.3 by
1.0 cm (volume = 2.7 cm^3).

Extensor Mechanism: Subcortical marrow edema along the anterolateral
portion of the lateral femoral condyle on image [DATE]. Although
potentially related to altered mechanics from the abnormal
morphology of the lateral tibial plateau, this is also a
characteristic location for impaction from transient patellar
dislocation and there is some subtle endosteal edema along the
inferior pole of the patella. I do not see a definite tear of the
medial patellar retinaculum or medial patellofemoral ligament.
Tibial tubercle-trochlear groove distance 0.8 cm. The femoral
trochlear groove is mildly shallow. Subtle subcortical marrow edema
along the upper margin of the tibial tubercle.

Bones: Subtle subcortical marrow edema along the proximal articular
surface of the fibula at the tibiofibular articulation, likely
degenerative or due to prior trauma.

Other: No supplemental non-categorized findings.
IMPRESSION: 1. Remote compression fracture of the posterior aspect of the
lateral tibial plateau with potentially up to 9 mm of depression.
This has healed in there is some marginal spurring as well as some
chondral heterogeneity along the articular surface. No free fragment
identified.
2. Moderate knee effusion with mild synovitis. Thickened medial
plica.
3. Subcortical marrow edema along the anterolateral portion of the
lateral femoral condyle could be from the altered mechanics related
to morphology of the lateral tibial plateau, but there is also some
minimal endosteal edema along the inferior pole of the patella and a
similar appearance could be caused by recent transient patellar
dislocation. No overt tear of the medial patellofemoral ligament or
medial patellar retinaculum is identified, but a relatively shallow
femoral trochlear groove may predispose to maltracking.
4. Subtle subcortical marrow edema along the upper margin of the
tibial tubercle, likely from mild stress reaction. Low-level marrow
edema in the proximal fibula along the proximal tibiofibular
articulation, likely degenerative.
5. Small (volume 2.7 cubic cm) Baker's cyst.

## 2018-11-01 DIAGNOSIS — K219 Gastro-esophageal reflux disease without esophagitis: Secondary | ICD-10-CM | POA: Insufficient documentation

## 2018-11-01 DIAGNOSIS — M359 Systemic involvement of connective tissue, unspecified: Secondary | ICD-10-CM | POA: Insufficient documentation

## 2018-11-01 DIAGNOSIS — K5900 Constipation, unspecified: Secondary | ICD-10-CM | POA: Insufficient documentation

## 2018-11-02 DIAGNOSIS — R590 Localized enlarged lymph nodes: Secondary | ICD-10-CM | POA: Insufficient documentation

## 2018-11-26 DIAGNOSIS — R300 Dysuria: Secondary | ICD-10-CM | POA: Insufficient documentation

## 2018-11-30 DIAGNOSIS — B0089 Other herpesviral infection: Secondary | ICD-10-CM | POA: Insufficient documentation

## 2018-11-30 DIAGNOSIS — K626 Ulcer of anus and rectum: Secondary | ICD-10-CM | POA: Insufficient documentation

## 2019-04-27 DIAGNOSIS — R202 Paresthesia of skin: Secondary | ICD-10-CM | POA: Insufficient documentation

## 2019-04-27 DIAGNOSIS — R200 Anesthesia of skin: Secondary | ICD-10-CM | POA: Insufficient documentation

## 2019-04-27 DIAGNOSIS — M79672 Pain in left foot: Secondary | ICD-10-CM | POA: Insufficient documentation

## 2019-06-03 DIAGNOSIS — M25531 Pain in right wrist: Secondary | ICD-10-CM | POA: Insufficient documentation

## 2019-09-03 DIAGNOSIS — M328 Other forms of systemic lupus erythematosus: Secondary | ICD-10-CM | POA: Insufficient documentation

## 2019-09-03 DIAGNOSIS — M0809 Unspecified juvenile rheumatoid arthritis, multiple sites: Secondary | ICD-10-CM | POA: Insufficient documentation

## 2019-09-09 DIAGNOSIS — R079 Chest pain, unspecified: Secondary | ICD-10-CM | POA: Insufficient documentation

## 2020-04-28 ENCOUNTER — Ambulatory Visit (INDEPENDENT_AMBULATORY_CARE_PROVIDER_SITE_OTHER): Payer: Medicaid Other | Admitting: Podiatry

## 2020-04-28 DIAGNOSIS — Z5329 Procedure and treatment not carried out because of patient's decision for other reasons: Secondary | ICD-10-CM

## 2020-04-28 NOTE — Progress Notes (Signed)
No show for appt. 

## 2020-07-28 ENCOUNTER — Ambulatory Visit (INDEPENDENT_AMBULATORY_CARE_PROVIDER_SITE_OTHER): Payer: Medicaid Other

## 2020-07-28 ENCOUNTER — Other Ambulatory Visit: Payer: Self-pay

## 2020-07-28 ENCOUNTER — Ambulatory Visit (INDEPENDENT_AMBULATORY_CARE_PROVIDER_SITE_OTHER): Payer: Medicaid Other | Admitting: Podiatry

## 2020-07-28 DIAGNOSIS — Q666 Other congenital valgus deformities of feet: Secondary | ICD-10-CM | POA: Diagnosis not present

## 2020-07-28 DIAGNOSIS — M2141 Flat foot [pes planus] (acquired), right foot: Secondary | ICD-10-CM

## 2020-07-28 DIAGNOSIS — M216X1 Other acquired deformities of right foot: Secondary | ICD-10-CM

## 2020-07-28 DIAGNOSIS — M76829 Posterior tibial tendinitis, unspecified leg: Secondary | ICD-10-CM

## 2020-07-28 DIAGNOSIS — M79672 Pain in left foot: Secondary | ICD-10-CM

## 2020-07-28 DIAGNOSIS — M25571 Pain in right ankle and joints of right foot: Secondary | ICD-10-CM

## 2020-07-28 DIAGNOSIS — M216X2 Other acquired deformities of left foot: Secondary | ICD-10-CM

## 2020-07-28 DIAGNOSIS — M25572 Pain in left ankle and joints of left foot: Secondary | ICD-10-CM

## 2020-07-28 DIAGNOSIS — M79671 Pain in right foot: Secondary | ICD-10-CM

## 2020-07-28 DIAGNOSIS — M2142 Flat foot [pes planus] (acquired), left foot: Secondary | ICD-10-CM

## 2020-07-28 DIAGNOSIS — I7300 Raynaud's syndrome without gangrene: Secondary | ICD-10-CM

## 2020-07-28 DIAGNOSIS — I319 Disease of pericardium, unspecified: Secondary | ICD-10-CM | POA: Insufficient documentation

## 2020-07-28 NOTE — Progress Notes (Signed)
  Subjective:  Patient ID: Dwayne Anderson, male    DOB: [birth year],  MRN: [id number]  Chief Complaint  Patient presents with  . Peripheral Neuropathy    Bilateral decrease in sensation on lateral aspects of both feet.  . Foot Problem    "My feet turn inwards"  . Foot Orthotics    Pt states has orthotics from [employer] but they are painful and he can only wear in short duration.  . Foot Problem    Raynaud's bilateral feet   24 y.o. male presents with the above complaint. History confirmed with patient.   Objective:  Physical Exam: blanching with elevation noted, no trophic changes or ulcerative lesions, normal DP and PT pulses, normal sensory exam and digital pallor consistent with Raynaud's. Pes planus, hindfoot valgus in stance. Able to perform single heel raise unable to perform double heel raise. POP sinus tarsi, TN joint bilat.  No images are attached to the encounter.  Radiographs: X-ray of both feet: Pes planus with midfoot break noted. No acute fractures. No degenerative changes. Assessment:   1. Pes planus of both feet   2. Congenital hindfoot valgus   3. 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 Raynaud's disease without gangrene    Plan:  Patient was evaluated and treated and all questions answered.  PTTD, Pes planus, Hindfoot valgus -X-rays reviewed as above -Dispensed Tri-Lock ankle brace.  Patient educated on use  -Will make appt for fabrication -Offered injection, declined.  Lupus, Raynaud's -Advised he talk to his PCP about Ca channel blockers for raynaud's -Lupus management per rheumatologist  Neuropathy -Offered pharmacologic management, declined.  Return in about 6 weeks (around 09/08/2020).

## 2020-08-18 ENCOUNTER — Other Ambulatory Visit: Payer: Self-pay | Admitting: Podiatry

## 2020-08-18 DIAGNOSIS — M2141 Flat foot [pes planus] (acquired), right foot: Secondary | ICD-10-CM

## 2020-08-21 ENCOUNTER — Other Ambulatory Visit: Payer: Medicaid Other | Admitting: Orthotics

## 2020-08-29 ENCOUNTER — Ambulatory Visit: Payer: Medicaid Other | Admitting: Podiatry
